# Patient Record
Sex: Female | Born: 1995 | Race: Black or African American | Hispanic: No | Marital: Single | Smoking: Current every day smoker
Health system: Southern US, Community
[De-identification: ages and names within clinical notes are randomized; demographics above are authoritative.]

---
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
## 2015-12-10 IMAGING — CR DG CHEST 2V
2 series · 2 of 2 positions shown · non-contrast
Comparison: None.

CLINICAL DATA: Chest pain

EXAM:
CHEST  2 VIEW

[w chest pa]
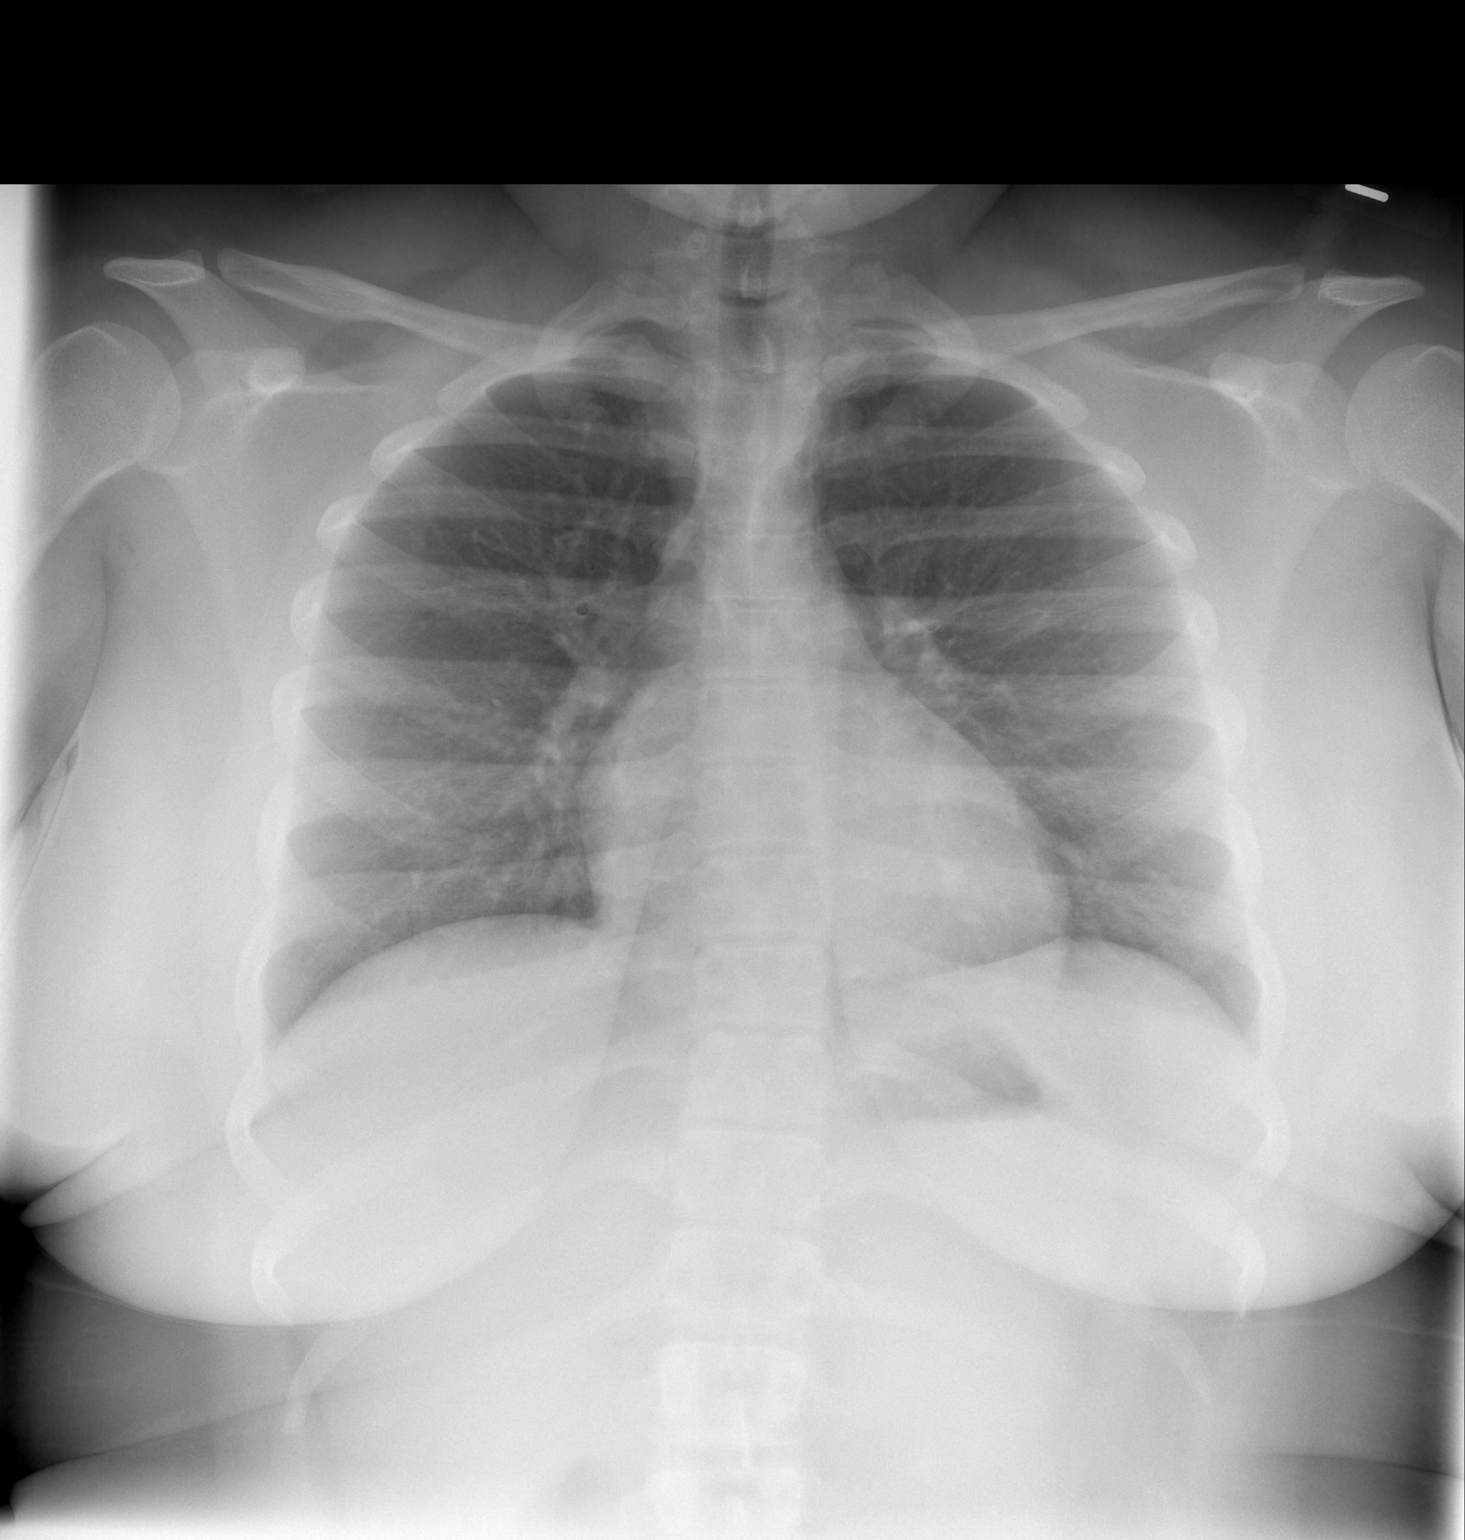

[w chest lat]
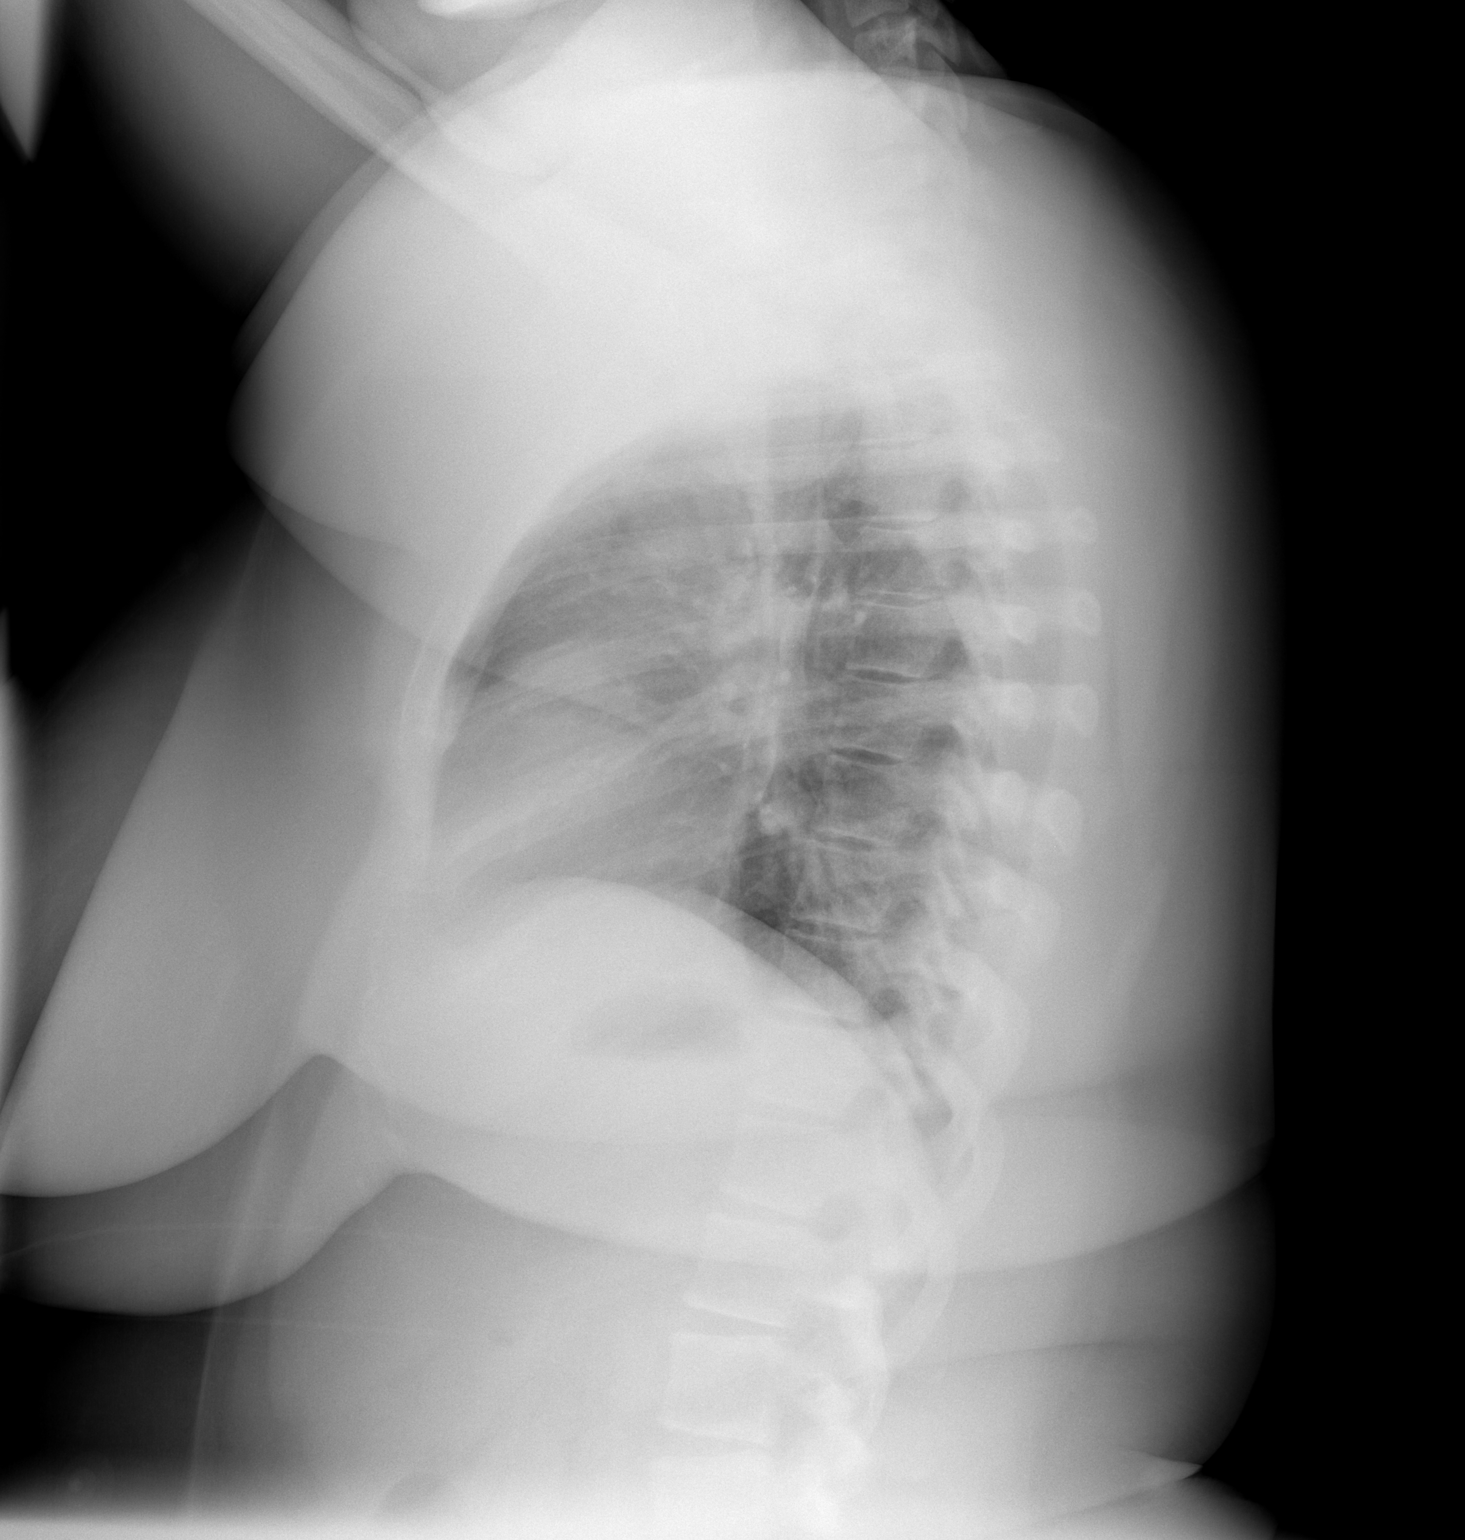

[2 of 2 positions shown; findings below may reference images not displayed]

FINDINGS: The heart size and mediastinal contours are within normal limits.
Both lungs are clear. The visualized skeletal structures are
unremarkable.
IMPRESSION: No active cardiopulmonary disease.
